# Patient Record
Sex: Male | Born: 2000 | Race: Black or African American | Hispanic: No | Marital: Single
Health system: Southern US, Community
[De-identification: ages and names within clinical notes are randomized; demographics above are authoritative.]

---

## 2019-02-27 ENCOUNTER — Emergency Department: Payer: BLUE CROSS/BLUE SHIELD

## 2019-02-27 ENCOUNTER — Other Ambulatory Visit: Payer: Self-pay

## 2019-02-27 ENCOUNTER — Emergency Department
Admission: EM | Admit: 2019-02-27 | Discharge: 2019-02-27 | Disposition: A | Discharge status: Home / Self Care | Payer: BLUE CROSS/BLUE SHIELD | Attending: Emergency Medicine | Admitting: Emergency Medicine

## 2019-02-27 DIAGNOSIS — Y92410 Unspecified street and highway as the place of occurrence of the external cause: Secondary | ICD-10-CM | POA: Insufficient documentation

## 2019-02-27 DIAGNOSIS — S8992XA Unspecified injury of left lower leg, initial encounter: Secondary | ICD-10-CM | POA: Diagnosis present

## 2019-02-27 DIAGNOSIS — R0789 Other chest pain: Secondary | ICD-10-CM | POA: Insufficient documentation

## 2019-02-27 DIAGNOSIS — S82035A Nondisplaced transverse fracture of left patella, initial encounter for closed fracture: Secondary | ICD-10-CM | POA: Diagnosis not present

## 2019-02-27 DIAGNOSIS — Y9389 Activity, other specified: Secondary | ICD-10-CM | POA: Diagnosis not present

## 2019-02-27 DIAGNOSIS — W2210XA Striking against or struck by unspecified automobile airbag, initial encounter: Secondary | ICD-10-CM | POA: Insufficient documentation

## 2019-02-27 DIAGNOSIS — Y998 Other external cause status: Secondary | ICD-10-CM | POA: Insufficient documentation

## 2019-02-27 MED ORDER — MELOXICAM 15 MG PO TABS: 15.0000 mg | ORAL_TABLET | Freq: Every day | ORAL | 0 refills | Status: AC

## 2019-02-27 MED ORDER — CYCLOBENZAPRINE HCL 10 MG PO TABS: 5.0000 mg | ORAL_TABLET | Freq: Three times a day (TID) | ORAL | 0 refills | Status: AC | PRN
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
## 2019-04-01 ENCOUNTER — Other Ambulatory Visit: Payer: Self-pay

## 2019-04-01 DIAGNOSIS — Z20822 Contact with and (suspected) exposure to covid-19: Secondary | ICD-10-CM

## 2019-04-03 LAB — NOVEL CORONAVIRUS, NAA: SARS-CoV-2, NAA: NOT DETECTED

## 2019-04-16 ENCOUNTER — Other Ambulatory Visit: Payer: Self-pay

## 2019-04-16 DIAGNOSIS — Z20822 Contact with and (suspected) exposure to covid-19: Secondary | ICD-10-CM

## 2019-04-18 LAB — NOVEL CORONAVIRUS, NAA: SARS-CoV-2, NAA: NOT DETECTED

## 2019-04-25 ENCOUNTER — Other Ambulatory Visit: Payer: Self-pay

## 2019-04-25 DIAGNOSIS — Z20822 Contact with and (suspected) exposure to covid-19: Secondary | ICD-10-CM

## 2019-04-27 LAB — NOVEL CORONAVIRUS, NAA: SARS-CoV-2, NAA: NOT DETECTED

## 2019-10-07 ENCOUNTER — Ambulatory Visit: Payer: BLUE CROSS/BLUE SHIELD | Attending: Internal Medicine

## 2019-10-07 DIAGNOSIS — Z23 Encounter for immunization: Secondary | ICD-10-CM

## 2019-10-07 NOTE — Progress Notes (Signed)
   Covid-19 Vaccination Clinic  Name:  Neiman Roots    MRN: [id number] DOB: 04/14/2001  10/07/2019  Mr. Dauphinais was observed post Covid-19 immunization for 15 minutes without incident. He was provided with Vaccine Information Sheet and instruction to access the V-Safe system.   Mr. Selvey was instructed to call 911 with any severe reactions post vaccine: Marland Kitchen Difficulty breathing  . Swelling of face and throat  . A fast heartbeat  . A bad rash all over body  . Dizziness and weakness   Immunizations Administered    Name Date Dose VIS Date Route   Pfizer COVID-19 Vaccine 10/07/2019  2:11 PM 0.3 mL 06/21/2019 Intramuscular   Manufacturer: [employer], [employer]   Lot: [id number]   NDC: [address]-3

## 2019-10-28 ENCOUNTER — Ambulatory Visit: Payer: BLUE CROSS/BLUE SHIELD | Attending: Internal Medicine

## 2019-10-28 DIAGNOSIS — Z23 Encounter for immunization: Secondary | ICD-10-CM

## 2019-10-28 NOTE — Progress Notes (Signed)
   Covid-19 Vaccination Clinic  Name:  Christian Gomez    MRN: [id number] DOB: [birth year]  10/28/2019  [name] was observed post Covid-19 immunization for 15 minutes without incident. He was provided with Vaccine Information Sheet and instruction to access the V-Safe system.   [name] was instructed to call 911 with any severe reactions post vaccine: Marland Kitchen Difficulty breathing  . Swelling of face and throat  . A fast heartbeat  . A bad rash all over body  . Dizziness and weakness   Immunizations Administered    Name Date Dose VIS Date Route   Pfizer COVID-19 Vaccine 10/28/2019  2:10 PM 0.3 mL 09/04/2018 Intramuscular   Manufacturer: [employer], [employer]   Lot: [id number]   NDC: [address]-2
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
